# Patient Record
Sex: Male | Born: 1965 | Race: White | Hispanic: No | Marital: Married | Smoking: Never smoker
Health system: Southern US, Community
[De-identification: ages and names within clinical notes are randomized; demographics above are authoritative.]

## PROBLEM LIST (undated history)

## (undated) DIAGNOSIS — I10 Essential (primary) hypertension: Secondary | ICD-10-CM

## (undated) DIAGNOSIS — M1990 Unspecified osteoarthritis, unspecified site: Secondary | ICD-10-CM

## (undated) DIAGNOSIS — Z8719 Personal history of other diseases of the digestive system: Secondary | ICD-10-CM

## (undated) HISTORY — PX: NASAL SEPTUM SURGERY: SHX37

---

## 2011-08-16 HISTORY — PX: KNEE ARTHROSCOPY: SHX127

## 2016-04-05 ENCOUNTER — Other Ambulatory Visit: Payer: Self-pay | Admitting: Physician Assistant

## 2016-04-05 NOTE — H&P (Signed)
TOTAL KNEE ADMISSION H&P  Patient is being admitted for right total knee arthroplasty.  Subjective:  Chief Complaint:right knee pain.  HPI: Wayne ShutterDarren Allen, 50 y.o. male, has a history of pain and functional disability in the right knee due to arthritis and has failed non-surgical conservative treatments for greater than 12 weeks to includeNSAID's and/or analgesics, corticosteriod injections and activity modification.  Onset of symptoms was gradual, starting 10 years ago with rapidlly worsening course since that time. The patient noted prior procedures on the knee to include  arthroscopy and menisectomy on the right knee(s).  Patient currently rates pain in the right knee(s) at 3 out of 10 with activity. Patient has night pain, worsening of pain with activity and weight bearing, crepitus and joint swelling.  Patient has evidence of subchondral sclerosis and joint space narrowing by imaging studies. There is no active infection.  There are no active problems to display for this patient.  No past medical history on file.  No past surgical history on file.   (Not in a hospital admission) Allergies not on file  Social History  Substance Use Topics  . Smoking status: Not on file  . Smokeless tobacco: Not on file  . Alcohol use Not on file    No family history on file.   Review of Systems  Constitutional: Negative.   HENT: Negative.   Eyes: Negative.   Respiratory: Negative.   Cardiovascular: Negative.   Gastrointestinal: Negative.   Genitourinary: Negative.   Musculoskeletal: Positive for joint pain.  Skin: Negative.   Neurological: Negative.   Endo/Heme/Allergies: Negative.   Psychiatric/Behavioral: Negative.     Objective:  Physical Exam  Constitutional: He is oriented to person, place, and time. He appears well-developed and well-nourished.  HENT:  Head: Normocephalic and atraumatic.  Eyes: EOM are normal. Pupils are equal, round, and reactive to light.  Neck: Normal range  of motion. Neck supple.  Cardiovascular: Normal rate and regular rhythm.   Respiratory: Effort normal and breath sounds normal.  GI: Soft. Bowel sounds are normal.  Musculoskeletal:  Examination of his right knee reveals trace effusion, range of motion 0 to 125 degrees, no joint line tenderness, minimal patellofemoral crepitus, positive anterior Drawer.  He is neurovascularly intact distally.    Neurological: He is alert and oriented to person, place, and time.  Skin: Skin is warm and dry.  Psychiatric: He has a normal mood and affect. His behavior is normal. Judgment and thought content normal.    Vital signs in last 24 hours: @VSRANGES @  Labs:   There is no height or weight on file to calculate BMI.   Imaging Review Plain radiographs demonstrate severe degenerative joint disease of the right knee(s). The overall alignment ismild varus. The bone quality appears to be fair for age and reported activity level.  Assessment/Plan:  End stage arthritis, right knee   The patient history, physical examination, clinical judgment of the provider and imaging studies are consistent with end stage degenerative joint disease of the right knee(s) and total knee arthroplasty is deemed medically necessary. The treatment options including medical management, injection therapy arthroscopy and arthroplasty were discussed at length. The risks and benefits of total knee arthroplasty were presented and reviewed. The risks due to aseptic loosening, infection, stiffness, patella tracking problems, thromboembolic complications and other imponderables were discussed. The patient acknowledged the explanation, agreed to proceed with the plan and consent was signed. Patient is being admitted for inpatient treatment for surgery, pain control, PT, OT, prophylactic antibiotics, VTE  prophylaxis, progressive ambulation and ADL's and discharge planning. The patient is planning to be discharged home with home health  services

## 2016-04-05 NOTE — H&P (Signed)
TOTAL KNEE ADMISSION H&P  Patient is being admitted for right total knee arthroplasty.  Subjective:  Chief Complaint:right knee pain.  HPI: Wayne ShutterDarren Allen, 50 y.o. male, has a history of pain and functional disability in the right knee due to arthritis and has failed non-surgical conservative treatments for greater than 12 weeks to includeNSAID's and/or analgesics, corticosteriod injections and activity modification.  Onset of symptoms was gradual, starting 10 years ago with gradually worsening course since that time. The patient noted prior procedures on the knee to include  arthroscopy and menisectomy on the right knee(s).  Patient currently rates pain in the right knee(s) at 3 out of 10 with activity. Patient has night pain, worsening of pain with activity and weight bearing, pain that interferes with activities of daily living, crepitus and joint swelling.  Patient has evidence of subchondral sclerosis and joint space narrowing by imaging studies. There is no active infection.  There are no active problems to display for this patient.  No past medical history on file.  No past surgical history on file.  No prescriptions prior to admission.   Allergies not on file  Social History  Substance Use Topics  . Smoking status: Not on file  . Smokeless tobacco: Not on file  . Alcohol use Not on file    No family history on file.   Review of Systems  Constitutional: Negative.   HENT: Negative.   Eyes: Negative.   Respiratory: Negative.   Cardiovascular: Negative.   Gastrointestinal: Negative.   Genitourinary: Negative.   Musculoskeletal: Positive for joint pain.  Skin: Negative.   Neurological: Negative.   Endo/Heme/Allergies: Negative.   Psychiatric/Behavioral: Negative.     Objective:  Physical Exam  Constitutional: He is oriented to person, place, and time. He appears well-developed and well-nourished.  HENT:  Head: Normocephalic and atraumatic.  Eyes: EOM are normal. Pupils  are equal, round, and reactive to light.  Neck: Normal range of motion. Neck supple.  Cardiovascular: Normal rate and regular rhythm.   Respiratory: Effort normal and breath sounds normal.  GI: Soft. Bowel sounds are normal.  Musculoskeletal:  Examination of his right knee reveals trace effusion, range of motion 0 to 125 degrees, no joint line tenderness, minimal patellofemoral crepitus, positive anterior Drawer.  He is neurovascularly intact distally.      Neurological: He is alert and oriented to person, place, and time.  Skin: Skin is warm and dry.  Psychiatric: He has a normal mood and affect. His behavior is normal. Judgment and thought content normal.    Vital signs in last 24 hours:    Labs:   There is no height or weight on file to calculate BMI.   Imaging Review Plain radiographs demonstrate severe degenerative joint disease of the right knee(s). The overall alignment ismild varus. The bone quality appears to be fair for age and reported activity level.  Assessment/Plan:  End stage arthritis, right knee   The patient history, physical examination, clinical judgment of the provider and imaging studies are consistent with end stage degenerative joint disease of the right knee(s) and total knee arthroplasty is deemed medically necessary. The treatment options including medical management, injection therapy arthroscopy and arthroplasty were discussed at length. The risks and benefits of total knee arthroplasty were presented and reviewed. The risks due to aseptic loosening, infection, stiffness, patella tracking problems, thromboembolic complications and other imponderables were discussed. The patient acknowledged the explanation, agreed to proceed with the plan and consent was signed. Patient is being admitted  for inpatient treatment for surgery, pain control, PT, OT, prophylactic antibiotics, VTE prophylaxis, progressive ambulation and ADL's and discharge planning. The patient is  planning to be discharged home with home health services

## 2016-04-07 NOTE — Pre-Procedure Instructions (Addendum)
    Wayne Allen  04/07/2016      [facility] [address] - , [place] - [address] [name] [place] [address] Phone: [phone] Fax: [phone]    Your procedure is scheduled on Wednesday, August 6.  [facility] at 6:30 A.M.                Your surgery or procedure is scheduled for 8:30 AM              Call this number if you have problems the morning of [phone]               For any other questions, please call [phone], Monday - Friday 8 AM - 4 PM.   Remember:  Do not eat food or drink liquids after midnight. Tuesday, September 5.             Take these medicines the morning of surgery with A SIP OF WATER -   Do not wear jewelry, make-up or nail polish.  Do not wear lotions, powders, or perfumes, or deodorant.  Do not shave 48 hours prior to surgery.    Do not bring valuables to the hospital.  [facility] is not responsible for any belongings or valuables.  Contacts, dentures or bridgework may not be worn into surgery.  Leave your suitcase in the car.  After surgery it may be brought to your room.  For patients admitted to the hospital, discharge time will be determined by your treatment team.  Special instructions:  Review  [place] - Preparing For Surgery.  Please read over the following fact sheets that you were given: [facility]- Pre[facility] and Patient Instructions for Mupirocin Application, Incentive Spirometry

## 2016-04-08 ENCOUNTER — Encounter (HOSPITAL_COMMUNITY): Payer: Self-pay

## 2016-04-08 ENCOUNTER — Encounter (HOSPITAL_COMMUNITY)
Admission: RE | Admit: 2016-04-08 | Discharge: 2016-04-08 | Disposition: A | Discharge status: Home / Self Care | Payer: Worker's Compensation | Source: Ambulatory Visit | Attending: Orthopedic Surgery | Admitting: Orthopedic Surgery

## 2016-04-08 DIAGNOSIS — R001 Bradycardia, unspecified: Secondary | ICD-10-CM | POA: Diagnosis not present

## 2016-04-08 DIAGNOSIS — M1711 Unilateral primary osteoarthritis, right knee: Secondary | ICD-10-CM | POA: Diagnosis not present

## 2016-04-08 DIAGNOSIS — Z0183 Encounter for blood typing: Secondary | ICD-10-CM | POA: Insufficient documentation

## 2016-04-08 DIAGNOSIS — Z01812 Encounter for preprocedural laboratory examination: Secondary | ICD-10-CM | POA: Diagnosis not present

## 2016-04-08 DIAGNOSIS — Z01818 Encounter for other preprocedural examination: Secondary | ICD-10-CM | POA: Diagnosis not present

## 2016-04-08 HISTORY — DX: Personal history of other diseases of the digestive system: Z87.19

## 2016-04-08 HISTORY — DX: Unspecified osteoarthritis, unspecified site: M19.90

## 2016-04-08 LAB — CBC
HCT: 45.4 % (ref 39.0–52.0)
Hemoglobin: 14.8 g/dL (ref 13.0–17.0)
MCH: 30.3 pg (ref 26.0–34.0)
MCHC: 32.6 g/dL (ref 30.0–36.0)
MCV: 93 fL (ref 78.0–100.0)
PLATELETS: 262 10*3/uL (ref 150–400)
RBC: 4.88 MIL/uL (ref 4.22–5.81)
RDW: 13.4 % (ref 11.5–15.5)
WBC: 7.1 10*3/uL (ref 4.0–10.5)

## 2016-04-08 LAB — TYPE AND SCREEN
ABO/RH(D): A POS
ANTIBODY SCREEN: NEGATIVE

## 2016-04-08 LAB — SURGICAL PCR SCREEN
MRSA, PCR: NEGATIVE
Staphylococcus aureus: NEGATIVE

## 2016-04-08 LAB — ABO/RH: ABO/RH(D): A POS

## 2016-04-08 NOTE — Progress Notes (Signed)
Pt. Goes to [facility] off of [address] [place]. As needed. States he hasn't had a physical in 6 yrs.

## 2016-04-11 ENCOUNTER — Other Ambulatory Visit (HOSPITAL_COMMUNITY): Payer: Self-pay

## 2016-04-19 MED ORDER — LACTATED RINGERS IV SOLN
INTRAVENOUS | Status: DC
  Administered 2016-04-20 (×3): via INTRAVENOUS

## 2016-04-19 MED ORDER — TRANEXAMIC ACID 1000 MG/10ML IV SOLN
1000.0000 mg | INTRAVENOUS | Status: AC
  Administered 2016-04-20: 1000 mg via INTRAVENOUS
  Filled 2016-04-19: qty 10

## 2016-04-19 MED ORDER — CEFAZOLIN SODIUM-DEXTROSE 2-4 GM/100ML-% IV SOLN
2.0000 g | INTRAVENOUS | Status: AC
  Administered 2016-04-20: 2 g via INTRAVENOUS
  Filled 2016-04-19: qty 100

## 2016-04-20 ENCOUNTER — Encounter (HOSPITAL_COMMUNITY)
Admission: RE | Disposition: A | Discharge status: Home / Self Care | Payer: Self-pay | Source: Ambulatory Visit | Attending: Orthopedic Surgery

## 2016-04-20 ENCOUNTER — Inpatient Hospital Stay (HOSPITAL_COMMUNITY): Payer: Worker's Compensation | Admitting: Certified Registered"

## 2016-04-20 ENCOUNTER — Encounter (HOSPITAL_COMMUNITY): Payer: Self-pay | Admitting: *Deleted

## 2016-04-20 ENCOUNTER — Inpatient Hospital Stay (HOSPITAL_COMMUNITY)
Admission: RE | Admit: 2016-04-20 | Discharge: 2016-04-21 | DRG: 470 | Disposition: A | Discharge status: Home / Self Care | Payer: Worker's Compensation | Source: Ambulatory Visit | Attending: Orthopedic Surgery | Admitting: Orthopedic Surgery

## 2016-04-20 ENCOUNTER — Inpatient Hospital Stay (HOSPITAL_COMMUNITY): Payer: Worker's Compensation

## 2016-04-20 DIAGNOSIS — M25561 Pain in right knee: Secondary | ICD-10-CM | POA: Diagnosis present

## 2016-04-20 DIAGNOSIS — M1711 Unilateral primary osteoarthritis, right knee: Principal | ICD-10-CM

## 2016-04-20 HISTORY — PX: TOTAL KNEE ARTHROPLASTY: SHX125

## 2016-04-20 SURGERY — ARTHROPLASTY, KNEE, TOTAL
Anesthesia: Spinal | Site: Knee | Laterality: Right

## 2016-04-20 MED ORDER — BUPIVACAINE LIPOSOME 1.3 % IJ SUSP
20.0000 mL | INTRAMUSCULAR | Status: AC
  Administered 2016-04-20: 20 mL
  Filled 2016-04-20: qty 20

## 2016-04-20 MED ORDER — PROMETHAZINE HCL 25 MG/ML IJ SOLN: 6.2500 mg | INTRAMUSCULAR | Status: DC | PRN

## 2016-04-20 MED ORDER — PROPOFOL 10 MG/ML IV BOLUS
INTRAVENOUS | Status: AC
  Filled 2016-04-20: qty 20

## 2016-04-20 MED ORDER — MIDAZOLAM HCL 2 MG/2ML IJ SOLN
INTRAMUSCULAR | Status: AC
  Filled 2016-04-20: qty 2

## 2016-04-20 MED ORDER — PROPOFOL 500 MG/50ML IV EMUL
INTRAVENOUS | Status: DC | PRN
  Administered 2016-04-20: 100 ug/kg/min via INTRAVENOUS
  Administered 2016-04-20: 125 ug/kg/min via INTRAVENOUS

## 2016-04-20 MED ORDER — METOCLOPRAMIDE HCL 5 MG PO TABS: 5.0000 mg | ORAL_TABLET | Freq: Three times a day (TID) | ORAL | Status: DC | PRN

## 2016-04-20 MED ORDER — ONDANSETRON HCL 4 MG/2ML IJ SOLN
INTRAMUSCULAR | Status: AC
  Filled 2016-04-20: qty 2

## 2016-04-20 MED ORDER — METHOCARBAMOL 1000 MG/10ML IJ SOLN
500.0000 mg | Freq: Four times a day (QID) | INTRAVENOUS | Status: DC | PRN
  Filled 2016-04-20: qty 5

## 2016-04-20 MED ORDER — ACETAMINOPHEN 325 MG PO TABS: 650.0000 mg | ORAL_TABLET | Freq: Four times a day (QID) | ORAL | Status: DC | PRN

## 2016-04-20 MED ORDER — CEFAZOLIN SODIUM-DEXTROSE 2-4 GM/100ML-% IV SOLN
2.0000 g | Freq: Four times a day (QID) | INTRAVENOUS | Status: AC
  Administered 2016-04-20 (×2): 2 g via INTRAVENOUS
  Filled 2016-04-20 (×2): qty 100

## 2016-04-20 MED ORDER — ACETAMINOPHEN 650 MG RE SUPP: 650.0000 mg | Freq: Four times a day (QID) | RECTAL | Status: DC | PRN

## 2016-04-20 MED ORDER — OXYCODONE HCL 5 MG/5ML PO SOLN: 5.0000 mg | Freq: Once | ORAL | Status: DC | PRN

## 2016-04-20 MED ORDER — SENNA 8.6 MG PO TABS
1.0000 | ORAL_TABLET | Freq: Two times a day (BID) | ORAL | Status: DC
  Administered 2016-04-20 – 2016-04-21 (×2): 8.6 mg via ORAL
  Filled 2016-04-20 (×2): qty 1

## 2016-04-20 MED ORDER — BUPIVACAINE IN DEXTROSE 0.75-8.25 % IT SOLN
INTRATHECAL | Status: DC | PRN
  Administered 2016-04-20: 1.8 mL via INTRATHECAL

## 2016-04-20 MED ORDER — METHOCARBAMOL 500 MG PO TABS: 500.0000 mg | ORAL_TABLET | Freq: Four times a day (QID) | ORAL | 0 refills | Status: AC | PRN

## 2016-04-20 MED ORDER — OXYCODONE HCL 5 MG PO TABS
ORAL_TABLET | ORAL | Status: AC
  Filled 2016-04-20: qty 2

## 2016-04-20 MED ORDER — MORPHINE SULFATE (PF) 2 MG/ML IV SOLN: 2.0000 mg | INTRAVENOUS | Status: DC | PRN

## 2016-04-20 MED ORDER — APIXABAN 2.5 MG PO TABS
2.5000 mg | ORAL_TABLET | Freq: Two times a day (BID) | ORAL | Status: DC
  Administered 2016-04-21: 2.5 mg via ORAL
  Filled 2016-04-20: qty 1

## 2016-04-20 MED ORDER — ONDANSETRON HCL 4 MG PO TABS: 4.0000 mg | ORAL_TABLET | Freq: Three times a day (TID) | ORAL | 0 refills | Status: AC | PRN

## 2016-04-20 MED ORDER — LIDOCAINE HCL (CARDIAC) 20 MG/ML IV SOLN
INTRAVENOUS | Status: DC | PRN
  Administered 2016-04-20: 40 mg via INTRATRACHEAL

## 2016-04-20 MED ORDER — DOCUSATE SODIUM 100 MG PO CAPS: 100.0000 mg | ORAL_CAPSULE | Freq: Two times a day (BID) | ORAL | 0 refills | Status: AC

## 2016-04-20 MED ORDER — DIPHENHYDRAMINE HCL 12.5 MG/5ML PO ELIX: 12.5000 mg | ORAL_SOLUTION | ORAL | Status: DC | PRN

## 2016-04-20 MED ORDER — PHENOL 1.4 % MT LIQD
1.0000 | OROMUCOSAL | Status: DC | PRN
Start: 2016-04-20 — End: 2016-04-21

## 2016-04-20 MED ORDER — SORBITOL 70 % SOLN: 30.0000 mL | Freq: Every day | Status: DC | PRN

## 2016-04-20 MED ORDER — BUPIVACAINE HCL (PF) 0.5 % IJ SOLN
INTRAMUSCULAR | Status: AC
  Filled 2016-04-20: qty 10

## 2016-04-20 MED ORDER — PROPOFOL 10 MG/ML IV BOLUS
INTRAVENOUS | Status: DC | PRN
  Administered 2016-04-20 (×4): 50 mg via INTRAVENOUS

## 2016-04-20 MED ORDER — ONDANSETRON HCL 4 MG PO TABS: 4.0000 mg | ORAL_TABLET | Freq: Four times a day (QID) | ORAL | Status: DC | PRN

## 2016-04-20 MED ORDER — DEXAMETHASONE SODIUM PHOSPHATE 10 MG/ML IJ SOLN
INTRAMUSCULAR | Status: AC
  Filled 2016-04-20: qty 1

## 2016-04-20 MED ORDER — METHOCARBAMOL 500 MG PO TABS
500.0000 mg | ORAL_TABLET | Freq: Four times a day (QID) | ORAL | Status: DC | PRN
  Administered 2016-04-20 – 2016-04-21 (×3): 500 mg via ORAL
  Filled 2016-04-20 (×3): qty 1

## 2016-04-20 MED ORDER — MIDAZOLAM HCL 5 MG/5ML IJ SOLN
INTRAMUSCULAR | Status: DC | PRN
  Administered 2016-04-20: 2 mg via INTRAVENOUS

## 2016-04-20 MED ORDER — FENTANYL CITRATE (PF) 100 MCG/2ML IJ SOLN
INTRAMUSCULAR | Status: AC
  Filled 2016-04-20: qty 2

## 2016-04-20 MED ORDER — DEXAMETHASONE SODIUM PHOSPHATE 10 MG/ML IJ SOLN
10.0000 mg | Freq: Once | INTRAMUSCULAR | Status: AC
  Administered 2016-04-21: 10 mg via INTRAVENOUS
  Filled 2016-04-20: qty 1

## 2016-04-20 MED ORDER — MENTHOL 3 MG MT LOZG: 1.0000 | LOZENGE | OROMUCOSAL | Status: DC | PRN

## 2016-04-20 MED ORDER — DOCUSATE SODIUM 100 MG PO CAPS
100.0000 mg | ORAL_CAPSULE | Freq: Two times a day (BID) | ORAL | Status: DC
  Administered 2016-04-20 – 2016-04-21 (×2): 100 mg via ORAL
  Filled 2016-04-20 (×2): qty 1

## 2016-04-20 MED ORDER — SODIUM CHLORIDE 0.9 % IR SOLN
Status: DC | PRN
  Administered 2016-04-20 (×2): 1000 mL

## 2016-04-20 MED ORDER — METOCLOPRAMIDE HCL 5 MG/ML IJ SOLN: 5.0000 mg | Freq: Three times a day (TID) | INTRAMUSCULAR | Status: DC | PRN

## 2016-04-20 MED ORDER — HYDROMORPHONE HCL 1 MG/ML IJ SOLN: 0.2500 mg | INTRAMUSCULAR | Status: DC | PRN

## 2016-04-20 MED ORDER — PROPOFOL 1000 MG/100ML IV EMUL
INTRAVENOUS | Status: AC
  Filled 2016-04-20: qty 100

## 2016-04-20 MED ORDER — OXYCODONE HCL 5 MG PO TABS: 5.0000 mg | ORAL_TABLET | Freq: Once | ORAL | Status: DC | PRN

## 2016-04-20 MED ORDER — DEXTROSE-NACL 5-0.45 % IV SOLN
INTRAVENOUS | Status: AC
  Administered 2016-04-20: 18:00:00 via INTRAVENOUS

## 2016-04-20 MED ORDER — OXYCODONE-ACETAMINOPHEN 5-325 MG PO TABS: 1.0000 | ORAL_TABLET | ORAL | 0 refills | Status: AC | PRN

## 2016-04-20 MED ORDER — ONDANSETRON HCL 4 MG/2ML IJ SOLN
INTRAMUSCULAR | Status: DC | PRN
  Administered 2016-04-20: 4 mg via INTRAVENOUS

## 2016-04-20 MED ORDER — POLYETHYLENE GLYCOL 3350 17 G PO PACK: 17.0000 g | PACK | Freq: Every day | ORAL | Status: DC | PRN

## 2016-04-20 MED ORDER — OXYCODONE HCL 5 MG PO TABS
5.0000 mg | ORAL_TABLET | ORAL | Status: DC | PRN
  Administered 2016-04-20 – 2016-04-21 (×8): 10 mg via ORAL
  Filled 2016-04-20 (×7): qty 2

## 2016-04-20 MED ORDER — APIXABAN 2.5 MG PO TABS: 2.5000 mg | ORAL_TABLET | Freq: Two times a day (BID) | ORAL | 0 refills | Status: AC

## 2016-04-20 MED ORDER — ONDANSETRON HCL 4 MG/2ML IJ SOLN: 4.0000 mg | Freq: Four times a day (QID) | INTRAMUSCULAR | Status: DC | PRN

## 2016-04-20 MED ORDER — SODIUM CHLORIDE 0.9 % IJ SOLN
INTRAMUSCULAR | Status: DC | PRN
  Administered 2016-04-20: 40 mL via INTRAVENOUS

## 2016-04-20 MED ORDER — FENTANYL CITRATE (PF) 100 MCG/2ML IJ SOLN
INTRAMUSCULAR | Status: DC | PRN
  Administered 2016-04-20 (×2): 50 ug via INTRAVENOUS
  Administered 2016-04-20: 100 ug via INTRAVENOUS

## 2016-04-20 MED ORDER — DEXAMETHASONE SODIUM PHOSPHATE 10 MG/ML IJ SOLN
INTRAMUSCULAR | Status: DC | PRN
  Administered 2016-04-20: 10 mg via INTRAVENOUS

## 2016-04-20 MED ORDER — BUPIVACAINE HCL 0.5 % IJ SOLN
INTRAMUSCULAR | Status: DC | PRN
  Administered 2016-04-20: 10 mL

## 2016-04-20 MED ORDER — CELECOXIB 200 MG PO CAPS
200.0000 mg | ORAL_CAPSULE | Freq: Two times a day (BID) | ORAL | Status: DC
  Administered 2016-04-20 – 2016-04-21 (×2): 200 mg via ORAL
  Filled 2016-04-20 (×2): qty 1

## 2016-04-20 SURGICAL SUPPLY — 61 items
BANDAGE ACE 4X5 VEL STRL LF (GAUZE/BANDAGES/DRESSINGS) ×3 IMPLANT
BANDAGE ACE 6X5 VEL STRL LF (GAUZE/BANDAGES/DRESSINGS) ×3 IMPLANT
BANDAGE ESMARK 6X9 LF (GAUZE/BANDAGES/DRESSINGS) ×1 IMPLANT
BENZOIN TINCTURE PRP APPL 2/3 (GAUZE/BANDAGES/DRESSINGS) ×3 IMPLANT
BLADE SAG 18X100X1.27 (BLADE) ×6 IMPLANT
BNDG ESMARK 6X9 LF (GAUZE/BANDAGES/DRESSINGS) ×3
BOWL SMART MIX CTS (DISPOSABLE) ×3 IMPLANT
CAPT KNEE TOTAL 3 ×3 IMPLANT
CEMENT BONE SIMPLEX SPEEDSET (Cement) ×6 IMPLANT
CLOSURE WOUND 1/2 X4 (GAUZE/BANDAGES/DRESSINGS) ×2
COVER SURGICAL LIGHT HANDLE (MISCELLANEOUS) ×3 IMPLANT
CUFF TOURNIQUET SINGLE 34IN LL (TOURNIQUET CUFF) ×3 IMPLANT
DRAPE EXTREMITY T 121X128X90 (DRAPE) ×3 IMPLANT
DRAPE IMP U-DRAPE 54X76 (DRAPES) ×3 IMPLANT
DRAPE PROXIMA HALF (DRAPES) ×3 IMPLANT
DRAPE U-SHAPE 47X51 STRL (DRAPES) ×3 IMPLANT
DRSG AQUACEL AG ADV 3.5X10 (GAUZE/BANDAGES/DRESSINGS) ×3 IMPLANT
DURAPREP 26ML APPLICATOR (WOUND CARE) ×6 IMPLANT
ELECT CAUTERY BLADE 6.4 (BLADE) ×3 IMPLANT
ELECT REM PT RETURN 9FT ADLT (ELECTROSURGICAL) ×3
ELECTRODE REM PT RTRN 9FT ADLT (ELECTROSURGICAL) ×1 IMPLANT
EVACUATOR 1/8 PVC DRAIN (DRAIN) IMPLANT
FACESHIELD WRAPAROUND (MASK) ×6 IMPLANT
GLOVE BIOGEL PI IND STRL 7.0 (GLOVE) ×1 IMPLANT
GLOVE BIOGEL PI INDICATOR 7.0 (GLOVE) ×2
GLOVE ORTHO TXT STRL SZ7.5 (GLOVE) ×3 IMPLANT
GLOVE SURG ORTHO 7.0 STRL STRW (GLOVE) ×3 IMPLANT
GOWN STRL REUS W/ TWL LRG LVL3 (GOWN DISPOSABLE) ×2 IMPLANT
GOWN STRL REUS W/ TWL XL LVL3 (GOWN DISPOSABLE) ×1 IMPLANT
GOWN STRL REUS W/TWL LRG LVL3 (GOWN DISPOSABLE) ×4
GOWN STRL REUS W/TWL XL LVL3 (GOWN DISPOSABLE) ×2
HANDPIECE INTERPULSE COAX TIP (DISPOSABLE) ×2
IMMOBILIZER KNEE 22 UNIV (SOFTGOODS) ×3 IMPLANT
IMMOBILIZER KNEE 24 THIGH 36 (MISCELLANEOUS) IMPLANT
IMMOBILIZER KNEE 24 UNIV (MISCELLANEOUS)
KIT BASIN OR (CUSTOM PROCEDURE TRAY) ×3 IMPLANT
KIT ROOM TURNOVER OR (KITS) ×3 IMPLANT
MANIFOLD NEPTUNE II (INSTRUMENTS) ×3 IMPLANT
NEEDLE 18GX1X1/2 (RX/OR ONLY) (NEEDLE) ×3 IMPLANT
NEEDLE HYPO 25GX1X1/2 BEV (NEEDLE) ×3 IMPLANT
NS IRRIG 1000ML POUR BTL (IV SOLUTION) ×3 IMPLANT
PACK TOTAL JOINT (CUSTOM PROCEDURE TRAY) ×3 IMPLANT
PACK UNIVERSAL I (CUSTOM PROCEDURE TRAY) ×3 IMPLANT
PAD ARMBOARD 7.5X6 YLW CONV (MISCELLANEOUS) ×6 IMPLANT
SET HNDPC FAN SPRY TIP SCT (DISPOSABLE) ×1 IMPLANT
STRIP CLOSURE SKIN 1/2X4 (GAUZE/BANDAGES/DRESSINGS) ×4 IMPLANT
SUCTION FRAZIER HANDLE 10FR (MISCELLANEOUS) ×2
SUCTION TUBE FRAZIER 10FR DISP (MISCELLANEOUS) ×1 IMPLANT
SUT MNCRL AB 4-0 PS2 18 (SUTURE) ×3 IMPLANT
SUT VIC AB 0 CT1 27 (SUTURE) ×2
SUT VIC AB 0 CT1 27XBRD ANBCTR (SUTURE) ×1 IMPLANT
SUT VIC AB 1 CTX 36 (SUTURE) ×2
SUT VIC AB 1 CTX36XBRD ANBCTR (SUTURE) ×1 IMPLANT
SUT VIC AB 2-0 CT1 27 (SUTURE) ×4
SUT VIC AB 2-0 CT1 TAPERPNT 27 (SUTURE) ×2 IMPLANT
SYR 50ML LL SCALE MARK (SYRINGE) ×3 IMPLANT
SYR CONTROL 10ML LL (SYRINGE) ×3 IMPLANT
TAPE STRIPS DRAPE STRL (GAUZE/BANDAGES/DRESSINGS) ×3 IMPLANT
TOWEL OR 17X24 6PK STRL BLUE (TOWEL DISPOSABLE) ×3 IMPLANT
TOWEL OR 17X26 10 PK STRL BLUE (TOWEL DISPOSABLE) ×3 IMPLANT
WATER STERILE IRR 1000ML POUR (IV SOLUTION) ×3 IMPLANT

## 2016-04-20 NOTE — Progress Notes (Signed)
Orthopedic Tech Progress Note Patient Details:  Enriqueta ShutterDarren Mol 06/02/1966 [id number]  CPM Right Knee CPM Right Knee: On Right Knee Flexion (Degrees): 90 Right Knee Extension (Degrees): 0 Additional Comments: trapeze bar patient helper   Nikki DomCrawford, Musab Wingard 04/20/2016, 1:21 PM Viewed order from doctor's order list;trapeze bar patient helper will be provided when one becomes available

## 2016-04-20 NOTE — Anesthesia Preprocedure Evaluation (Signed)
Anesthesia Evaluation  Patient identified by MRN, date of birth, ID band Patient awake    Reviewed: Allergy & Precautions, NPO status , Patient's Chart, lab work & pertinent test results  Airway Mallampati: II  TM Distance: >3 FB Neck ROM: Full    Dental no notable dental hx.    Pulmonary neg pulmonary ROS,    Pulmonary exam normal        Cardiovascular negative cardio ROS Normal cardiovascular exam     Neuro/Psych negative neurological ROS     GI/Hepatic Neg liver ROS, hiatal hernia,   Endo/Other  negative endocrine ROS  Renal/GU negative Renal ROS     Musculoskeletal  (+) Arthritis ,   Abdominal   Peds  Hematology negative hematology ROS (+)   Anesthesia Other Findings Day of surgery medications reviewed with the patient.  Reproductive/Obstetrics                             Anesthesia Physical Anesthesia Plan  ASA: I  Anesthesia Plan: Spinal   Post-op Pain Management:    Induction:   Airway Management Planned:   Additional Equipment:   Intra-op Plan:   Post-operative Plan:   Informed Consent:   Plan Discussed with:   Anesthesia Plan Comments:         Anesthesia Quick Evaluation

## 2016-04-20 NOTE — Transfer of Care (Signed)
Immediate Anesthesia Transfer of Care Note  Patient: Wayne ShutterDarren Allen  Procedure(s) Performed: Procedure(s): RIGHT TOTAL KNEE ARTHROPLASTY (Right)  Patient Location: PACU  Anesthesia Type:Spinal  Level of Consciousness: awake, alert  and oriented  Airway & Oxygen Therapy: Patient Spontanous Breathing  Post-op Assessment: Report given to RN and Post -op Vital signs reviewed and stable  Post vital signs: Reviewed and stable  Last Vitals:  Vitals:   04/20/16 0827 04/20/16 0942  BP: 138/87 (!) 146/92  Pulse: 75 72  Resp: 20 18  Temp: 37 C 37.1 C    Last Pain:  Vitals:   04/20/16 0827  TempSrc: Oral         Complications: No apparent anesthesia complications

## 2016-04-20 NOTE — Interval H&P Note (Signed)
History and Physical Interval Note:  04/20/2016 8:29 AM  Wayne Allen  has presented today for surgery, with the diagnosis of djd right knee  The various methods of treatment have been discussed with the patient and family. After consideration of risks, benefits and other options for treatment, the patient has consented to  Procedure(s): RIGHT TOTAL KNEE ARTHROPLASTY (Right) as a surgical intervention .  The patient's history has been reviewed, patient examined, no change in status, stable for surgery.  I have reviewed the patient's chart and labs.  Questions were answered to the patient's satisfaction.     Loreta Aveaniel F Christinia Lambeth

## 2016-04-20 NOTE — Anesthesia Procedure Notes (Signed)
Procedure Name: MAC Date/Time: 04/20/2016 11:06 AM Performed by: Doyce LooseHOFFMAN, Noam Franzen ANN Pre-anesthesia Checklist: Patient identified, Emergency Drugs available, Suction available and Patient being monitored Patient Re-evaluated:Patient Re-evaluated prior to inductionOxygen Delivery Method: Simple face mask Intubation Type: IV induction

## 2016-04-20 NOTE — Anesthesia Procedure Notes (Signed)
Spinal  Patient location during procedure: OR Start time: 04/20/2016 11:01 AM End time: 04/20/2016 11:05 AM Staffing Anesthesiologist: Bonita QuinGUIDETTI, Jhoan Schmieder S Performed: anesthesiologist  Preanesthetic Checklist Completed: patient identified, site marked, surgical consent, pre-op evaluation, timeout performed, IV checked, risks and benefits discussed and monitors and equipment checked Spinal Block Patient position: sitting Prep: ChloraPrep Patient monitoring: heart rate, cardiac monitor, continuous pulse ox and blood pressure Approach: midline Location: L4-5 Injection technique: single-shot Needle Needle type: Pencil-Tip  Needle gauge: 25 G

## 2016-04-21 ENCOUNTER — Encounter (HOSPITAL_COMMUNITY): Payer: Self-pay | Admitting: Orthopedic Surgery

## 2016-04-21 NOTE — Anesthesia Postprocedure Evaluation (Signed)
Anesthesia Post Note  Patient: [name]  Procedure(s) Performed: Procedure(s) (LRB): RIGHT TOTAL KNEE ARTHROPLASTY (Right)  Patient location during evaluation: PACU Anesthesia Type: Spinal Level of consciousness: oriented and awake and alert Pain management: pain level controlled Vital Signs Assessment: post-procedure vital signs reviewed and stable Respiratory status: spontaneous breathing, respiratory function stable and patient connected to nasal cannula oxygen Cardiovascular status: blood pressure returned to baseline and stable Postop Assessment: no headache and no backache Anesthetic complications: no     Last Vitals:  Vitals:   04/21/16 0441 04/21/16 0553  BP: (!) 153/102 134/82  Pulse: 66   Resp: 16   Temp: 36.6 C     Last Pain:  Vitals:   04/21/16 0837  TempSrc:   PainSc: 5    Pain Goal: Patients Stated Pain Goal: 0 (04/21/16 0837)               Bonita Quinichard S Daja Shuping

## 2016-04-21 NOTE — Progress Notes (Signed)
PT Progress Note  Patient is making good progress with PT.  From a mobility standpoint anticipate patient will be ready for DC home with family support. Pt and spouse deny any questions or concerns.     04/21/16 1444  PT Visit Information  Last PT Received On 04/21/16  Assistance Needed +1  History of Present Illness 50 y.o. male now s/p modified minimally invasive total knee arthroplasty.  Subjective Data  Subjective be able to get home  Patient Stated Goal be able to dance again  Precautions  Precautions Knee;Fall  Restrictions  Weight Bearing Restrictions Yes  RLE Weight Bearing WBAT  Pain Assessment  Pain Assessment Faces  Faces Pain Scale 4  Pain Location Rt thigh  Pain Descriptors / Indicators Aching  Pain Intervention(s) Limited activity within patient's tolerance;Monitored during session  Cognition  Arousal/Alertness Awake/alert  Behavior During Therapy WFL for tasks assessed/performed  Overall Cognitive Status Within Functional Limits for tasks assessed  Bed Mobility  General bed mobility comments up in chair  Transfers  Overall transfer level Needs assistance  Equipment used Rolling walker (2 wheeled)  Transfers Sit to/from Stand  Sit to Stand Supervision  General transfer comment stable transfers performed  Ambulation/Gait  Ambulation/Gait assistance Supervision  Ambulation Distance (Feet) 110 Feet  Assistive device Rolling walker (2 wheeled)  Gait Pattern/deviations Step-through pattern  General Gait Details cues for even strides and knee flexion  Gait velocity decreased  Stairs Yes  Stairs assistance Min guard  Stair Management One rail Left;Step to pattern;Sideways  Number of Stairs 10  General stair comments pt and spouse report feeling confident with doing stairs. Also reviewed posterior approach with single step with backward approach.   Balance  Overall balance assessment Needs assistance  Sitting-balance support No upper extremity supported   Sitting balance-Leahy Scale Good  Standing balance support During functional activity  Standing balance-Leahy Scale Fair  Standing balance comment able to stand without support  Exercises  Exercises Total Joint  Total Joint Exercises  Ankle Circles/Pumps AROM;Both;10 reps  [employer]Quad Sets Strengthening;Right;10 reps  Short Arc Quad Strengthening;Right;10 reps  Heel Slides AAROM;Right;10 reps  Straight Leg Raises Strengthening;Right;5 reps  Long Arc Quad Strengthening;Right;5 reps  Knee Flexion AROM;Right;5 reps  Goniometric ROM knee flexion 70 degrees  PT - End of Session  Equipment Utilized During Treatment Gait belt  Activity Tolerance Patient tolerated treatment well  Patient left in chair;with call bell/phone within reach;with family/visitor present (in knee extension)  Nurse Communication Mobility status;Weight bearing status  PT - Assessment/Plan  PT Plan Current plan remains appropriate  PT Frequency (ACUTE ONLY) 7X/week  Follow Up Recommendations Home health PT;Supervision for mobility/OOB  PT equipment Rolling walker with 5" wheels  PT Goal Progression  Progress towards PT goals Progressing toward goals  Acute Rehab PT Goals  PT Goal Formulation With patient  Time For Goal Achievement 05/05/16  Potential to Achieve Goals Good  PT Time Calculation  PT Start Time (ACUTE ONLY) 1401  PT Stop Time (ACUTE ONLY) 1439  PT Time Calculation (min) (ACUTE ONLY) 38 min  PT General Charges  $$ ACUTE PT VISIT 1 Procedure  PT Treatments  $Gait Training 8-22 mins  $Therapeutic Exercise 23-37 mins  Christiane HaBenjamin J. Kaidon Kinker, PT, CSCS Pager [phone] Office [phone]

## 2016-04-21 NOTE — Discharge Instructions (Signed)
INSTRUCTIONS AFTER JOINT REPLACEMENT   o Remove items at home which could result in a fall. This includes throw rugs or furniture in walking pathways o ICE to the affected joint every three hours while awake for 30 minutes at a time, for at least the first 3-5 days, and then as needed for pain and swelling.  Continue to use ice for pain and swelling. You may notice swelling that will progress down to the foot and ankle.  This is normal after surgery.  Elevate your leg when you are not up walking on it.   o Continue to use the breathing machine you got in the hospital (incentive spirometer) which will help keep your temperature down.  It is common for your temperature to cycle up and down following surgery, especially at night when you are not up moving around and exerting yourself.  The breathing machine keeps your lungs expanded and your temperature down.  Take Eliquis as prescribed to help prevent blood clots.   DIET:  As you were doing prior to hospitalization, we recommend a well-balanced diet.  DRESSING / WOUND CARE / SHOWERING  Keep the surgical dressing until follow up.  IF THE DRESSING FALLS OFF or the wound gets wet inside, change the dressing with sterile gauze.  Please use good hand washing techniques before changing the dressing.  Do not use any lotions or creams on the incision until instructed by your surgeon.    ACTIVITY  o Increase activity slowly as tolerated, but follow the weight bearing instructions below.   o No driving for 6 weeks or until further direction given by your physician.  You cannot drive while taking narcotics.  o No lifting or carrying greater than 10 lbs. until further directed by your surgeon. o Avoid periods of inactivity such as sitting longer than an hour when not asleep. This helps prevent blood clots.  o You may return to work once you are authorized by your doctor.     WEIGHT BEARING   Weight bearing as tolerated with assist device (walker, cane,  etc) as directed, use it as long as suggested by your surgeon or therapist, typically at least 4-6 weeks.   EXERCISES  Results after joint replacement surgery are often greatly improved when you follow the exercise, range of motion and muscle strengthening exercises prescribed by your doctor. Safety measures are also important to protect the joint from further injury. Any time any of these exercises cause you to have increased pain or swelling, decrease what you are doing until you are comfortable again and then slowly increase them. If you have problems or questions, call your caregiver or physical therapist for advice.   Rehabilitation is important following a joint replacement. After just a few days of immobilization, the muscles of the leg can become weakened and shrink (atrophy).  These exercises are designed to build up the tone and strength of the thigh and leg muscles and to improve motion. Often times heat used for twenty to thirty minutes before working out will loosen up your tissues and help with improving the range of motion but do not use heat for the first two weeks following surgery (sometimes heat can increase post-operative swelling).   These exercises can be done on a training (exercise) mat, on the floor, on a table or on a bed. Use whatever works the best and is most comfortable for you.    Use music or television while you are exercising so that the exercises are a  pleasant break in your day. This will make your life better with the exercises acting as a break in your routine that you can look forward to.   Perform all exercises about fifteen times, three times per day or as directed.  You should exercise both the operative leg and the other leg as well.  Exercises include:    Quad Sets - Tighten up the muscle on the front of the thigh (Quad) and hold for 5-10 seconds.    Straight Leg Raises - With your knee straight (if you were given a brace, keep it on), lift the leg to 60  degrees, hold for 3 seconds, and slowly lower the leg.  Perform this exercise against resistance later as your leg gets stronger.   Leg Slides: Lying on your back, slowly slide your foot toward your buttocks, bending your knee up off the floor (only go as far as is comfortable). Then slowly slide your foot back down until your leg is flat on the floor again.   Angel Wings: Lying on your back spread your legs to the side as far apart as you can without causing discomfort.   Hamstring Strength:  Lying on your back, push your heel against the floor with your leg straight by tightening up the muscles of your buttocks.  Repeat, but this time bend your knee to a comfortable angle, and push your heel against the floor.  You may put a pillow under the heel to make it more comfortable if necessary.   A rehabilitation program following joint replacement surgery can speed recovery and prevent re-injury in the future due to weakened muscles. Contact your doctor or a physical therapist for more information on knee rehabilitation.    CONSTIPATION  Constipation is defined medically as fewer than three stools per week and severe constipation as less than one stool per week.  Even if you have a regular bowel pattern at home, your normal regimen is likely to be disrupted due to multiple reasons following surgery.  Combination of anesthesia, postoperative narcotics, change in appetite and fluid intake all can affect your bowels.   YOU MUST use at least one of the following options; they are listed in order of increasing strength to get the job done.  They are all available over the counter, and you may need to use some, POSSIBLY even all of these options:    Drink plenty of fluids (prune juice may be helpful) and high fiber foods Colace 100 mg by mouth twice a day  Senokot for constipation as directed and as needed Dulcolax (bisacodyl), take with full glass of water  Miralax (polyethylene glycol) once or twice a  day as needed.  If you have tried all these things and are unable to have a bowel movement in the first 3-4 days after surgery call either your surgeon or your primary doctor.    If you experience loose stools or diarrhea, hold the medications until you stool forms back up.  If your symptoms do not get better within 1 week or if they get worse, check with your doctor.  If you experience "the worst abdominal pain ever" or develop nausea or vomiting, please contact the office immediately for further recommendations for treatment.   ITCHING:  If you experience itching with your medications, try taking only a single pain pill, or even half a pain pill at a time.  You can also use Benadryl over the counter for itching or also to help with sleep.  TED HOSE STOCKINGS:  Use stockings on both legs until for at least 2 weeks or as directed by physician office. They may be removed at night for sleeping.  MEDICATIONS:  See your medication summary on the After Visit Summary that nursing will review with you.  You may have some home medications which will be placed on hold until you complete the course of blood thinner medication.  It is important for you to complete the blood thinner medication as prescribed.  PRECAUTIONS:  If you experience chest pain or shortness of breath - call 911 immediately for transfer to the hospital emergency department.   If you develop a fever greater that 101 F, purulent drainage from wound, increased redness or drainage from wound, foul odor from the wound/dressing, or calf pain - CONTACT YOUR SURGEON.                                                   FOLLOW-UP APPOINTMENTS:  If you do not already have a post-op appointment, please call the office for an appointment to be seen by your surgeon.  Guidelines for how soon to be seen are listed in your After Visit Summary, but are typically between 1-4 weeks after surgery.  OTHER INSTRUCTIONS:   Knee Replacement:  Do not place  pillow under knee, focus on keeping the knee straight while resting. CPM instructions: 0-90 degrees, 2 hours in the morning, 2 hours in the afternoon, and 2 hours in the evening. Place foam block, curve side up under heel at all times except when in CPM or when walking.  DO NOT modify, tear, cut, or change the foam block in any way.  MAKE SURE YOU:   Understand these instructions.   Get help right away if you are not doing well or get worse.    Thank you for letting us be a part of your medical care team.  It is a privilege we respect greatly.  We hope these instructions will help you stay on track for a fast and full recovery!   Information on my medicine - ELIQUIS (apixaban)  This medication education was reviewed with me or my healthcare representative as part of my discharge preparation.  The pharmacist that spoke with me during my hospital stay was:  Steffanie Rainwater, Student-PharmD  Why was Eliquis prescribed for you? Eliquis was prescribed for you to reduce the risk of blood clots forming after orthopedic surgery.    What do You need to know about Eliquis? Take your Eliquis TWICE DAILY - one tablet in the morning and one tablet in the evening with or without food.  It would be best to take the dose about the same time each day.  If you have difficulty swallowing the tablet whole please discuss with your pharmacist how to take the medication safely.  Take Eliquis exactly as prescribed by your doctor and DO NOT stop taking Eliquis without talking to the doctor who prescribed the medication.  Stopping without other medication to take the place of Eliquis may increase your risk of developing a clot.  After discharge, you should have regular check-up appointments with your healthcare provider that is prescribing your Eliquis.  What do you do if you miss a dose? If a dose of ELIQUIS is not taken at the scheduled time, take it as soon as possible on  the same day and twice-daily  administration should be resumed.  The dose should not be doubled to make up for a missed dose.  Do not take more than one tablet of ELIQUIS at the same time.  Important Safety Information A possible side effect of Eliquis is bleeding. You should call your healthcare provider right away if you experience any of the following: ? Bleeding from an injury or your nose that does not stop. ? Unusual colored urine (red or dark brown) or unusual colored stools (red or black). ? Unusual bruising for unknown reasons. ? A serious fall or if you hit your head (even if there is no bleeding).  Some medicines may interact with Eliquis and might increase your risk of bleeding or clotting while on Eliquis. To help avoid this, consult your healthcare provider or pharmacist prior to using any new prescription or non-prescription medications, including herbals, vitamins, non-steroidal anti-inflammatory drugs (NSAIDs) and supplements.  This website has more information on Eliquis (apixaban): http://www.eliquis.com/eliquis/home

## 2016-04-21 NOTE — Progress Notes (Signed)
Orthopedic Tech Progress Note Patient Details:  Enriqueta ShutterDarren Hinchey 04/19/1966 [id number]  Patient ID: Enriqueta Shutterarren Mealing, male   DOB: 04/24/1966, 50 y.o.   MRN: [id number] Applied cpm 0-60  Trinna PostMartinez, Linder Prajapati J 04/21/2016, 5:42 AM

## 2016-04-21 NOTE — Op Note (Signed)
[name]:  Wayne Allen, Wayne Allen               ACCOUNT NO.:  [id number]  MEDICAL RECORD NO.:  [id number]  LOCATION:  5N05C                        FACILITY:  [facility]  PHYSICIAN:  Loreta Aveaniel F. Murphy, M.D. DATE OF BIRTH:  1965/09/29  DATE OF PROCEDURE:  04/20/2016 DATE OF DISCHARGE:                              OPERATIVE REPORT   PREOPERATIVE DIAGNOSES: 1. Right knee end-stage arthritis, primary localized. 2. Chronic ACL deficiency. 3. Bone loss, lateral compartment, moderate.  POSTOPERATIVE DIAGNOSES: 1. Right knee end-stage arthritis, primary localized. 2. Chronic ACL deficiency. 3. Bone loss, lateral compartment, moderate.  PROCEDURE:  Right knee modified minimally invasive total knee replacement with Stryker Triathlon prosthesis.  A cemented pegged posterior stabilized #5 femoral component.  Cemented #6 tibial component, 9 mm PS insert.  Cemented resurfacing 38 mm patella component.  SURGEON:  Loreta Aveaniel F. Murphy, M.D.  ASSISTANT:  Ernest MallickHenry Mortenson, P.A., present throughout the entire case and necessary for timely completion of procedure.  ANESTHESIA:  Spinal.  BLOOD LOSS:  Minimal.  SPECIMENS:  None.  CULTURES:  None.  COMPLICATIONS:  None.  DRESSINGS:  Sterile compressive, knee immobilizer.  TOURNIQUET TIME:  1 hour.  PROCEDURE:  The patient was brought to operating room, and after adequate anesthesia had been obtained, tourniquet applied, prepped and draped in the usual sterile fashion.  Exsanguinated with elevation of Esmarch, tourniquet inflated to 350 mmHg.  Straight incision above the patella down to tibial tubercle.  Medial arthrotomy, vastus splitting, preserving quad tendon.  ACL deficiency, marked bony overgrowth and spurs throughout.  Intramedullary guide, distal femur.  8 mm resection, 5 degrees of valgus.  The femur was much wider, medial to lateral than AP.  Best fitting achieved with a #5 posterior stabilized pegged component.  This was placed after preparation of  the femur.  Proximal tibial resection with extramedullary guide.  A 3-degree posterior slope cut.  Sized to #6 component, rotation set with trials, and hand reamed. Patella exposed.  Posterior 10 mm removed.  Drilled, sized, and fitted for a 38 mm component.  At completion with a 9 mm PS insert, very pleased with balancing, full motion, stability, patellar tracking.  All trials removed.  Copious irrigation with pulse irrigating device. Cement prepared and placed on all components, firmly seated. Polyethylene attached to tibia, knee reduced.  Patella with a clamp. Once the cement hardened, the knee was irrigated again.  Soft tissues were injected with Exparel.  Arthrotomy closed with #1 Vicryl, the subcutaneous with subcuticular closure.  Margins were injected with Marcaine.  Sterile compressive dressing applied.  Tourniquet deflated, removed.  Knee immobilizer applied.  Anesthesia reversed.  Brought to the recovery room.  Tolerated the surgery well.  No complications.     Loreta Aveaniel F. Murphy, M.D.     DFM/MEDQ  D:  04/20/2016  T:  04/21/2016  Job:  [id number]

## 2016-04-21 NOTE — Discharge Summary (Signed)
Discharge Summary  Patient ID: Wayne Allen MRN: [id number] DOB/AGE: 1965-10-31 50 y.o.  Admit date: 04/20/2016 Discharge date: 04/21/2016  Admission Diagnoses:  Primary osteoarthritis of right knee  Discharge Diagnoses:  Principal Problem:   Primary osteoarthritis of right knee   Past Medical History:  Diagnosis Date  . Arthritis    knee- right  . History of hiatal hernia     Surgeries: Procedure(s): RIGHT TOTAL KNEE ARTHROPLASTY on 04/20/2016   Consultants (if any):   Discharged Condition: Improved  Subjective: Feeling good.  OOB.  Pain controlled with PO meds.  Tolerating diet.  Urinating.  No CP, SOB.  General: NAD.  Supine in bed.   Resp: No increased WOB. Cardio: regular rate and rhythm ABD soft Neurologically intact MSK Neurovascularly intact Sensation intact distally Dorsiflexion/Plantar flexion intact Incision: dressing C/D/I   Weight Bearing: Weight Bearing as Tolerated (WBAT) right leg Dressings: Aquacel.    Hospital Course: Wayne Allen is an 50 y.o. male who was admitted 04/20/2016 with a diagnosis of Primary osteoarthritis of right knee and went to the operating room on 04/20/2016 and underwent the above named procedures.    He was given perioperative antibiotics:  Anti-infectives    Start     Dose/Rate Route Frequency Ordered Stop   04/20/16 1800  ceFAZolin (ANCEF) IVPB 2g/100 mL premix     2 g 200 mL/hr over 30 Minutes Intravenous Every 6 hours 04/20/16 1720 04/21/16 0028   04/20/16 0800  ceFAZolin (ANCEF) IVPB 2g/100 mL premix     2 g 200 mL/hr over 30 Minutes Intravenous To ShortStay Surgical 04/19/16 1320 04/20/16 1119    .  He was given sequential compression devices, early ambulation, and Eliquis for DVT prophylaxis.  He benefited maximally from the hospital stay and there were no complications.    Recent vital signs:  Vitals:   04/21/16 0441 04/21/16 0553  BP: (!) 153/102 134/82  Pulse: 66   Resp: 16   Temp: 97.9 F (36.6 C)      Recent laboratory studies:  Lab Results  Component Value Date   HGB 14.8 04/08/2016   Lab Results  Component Value Date   WBC 7.1 04/08/2016   PLT 262 04/08/2016   No results found for: INR No results found for: NA, K, CL, CO2, BUN, CREATININE, GLUCOSE  Discharge Medications:     Medication List    TAKE these medications   apixaban 2.5 MG Tabs tablet Commonly known as:  ELIQUIS Take 1 tablet (2.5 mg total) by mouth 2 (two) times daily.   docusate sodium 100 MG capsule Commonly known as:  COLACE Take 1 capsule (100 mg total) by mouth 2 (two) times daily. To prevent constipation while taking pain medication.   methocarbamol 500 MG tablet Commonly known as:  ROBAXIN Take 1 tablet (500 mg total) by mouth every 6 (six) hours as needed for muscle spasms.   ondansetron 4 MG tablet Commonly known as:  ZOFRAN Take 1 tablet (4 mg total) by mouth every 8 (eight) hours as needed for nausea or vomiting.   oxyCODONE-acetaminophen 5-325 MG tablet Commonly known as:  ROXICET Take 1-2 tablets by mouth every 4 (four) hours as needed for severe pain.       Diagnostic Studies: Dg Knee 2 Views Right  Result Date: 04/20/2016 CLINICAL DATA:  Postop right knee total replacement EXAM: RIGHT KNEE - 3 VIEW COMPARISON:  None.  None FINDINGS: No evidence AP and portable cross-table lateral views of the right knee. Patient is status  post right knee replacement. There is no acute fracture or malalignment. There is gas in the soft tissues and the joint space, felt postoperative. Soft tissues are otherwise unremarkable. Of fracture, dislocation, or joint effusion. No evidence of arthropathy or other focal bone abnormality. Soft tissues are unremarkable. IMPRESSION: 1. Status post right knee replacement effusion 2. Gas in the soft tissues and joint space, felt postoperative Electronically Signed   By: Jasmine PangKim  Fujinaga M.D.   On: 04/20/2016 13:18    Disposition: Home with Home health PT   Follow-up  Information    Loreta Aveaniel F Murphy, MD. Schedule an appointment as soon as possible for a visit in 10 day(s).   Specialty:  Orthopedic Surgery Contact information: [address] [place] [address] [phone]            Signed: Albina BilletHenry Calvin Martensen III PA-C 04/21/2016, 6:33 AM

## 2016-04-21 NOTE — Evaluation (Signed)
Physical Therapy Evaluation Patient Details Name: Wayne ShutterDarren Allen MRN: [id number] DOB: 09/03/1965 Today's Date: 04/21/2016   History of Present Illness  50 y.o. male now s/p modified minimally invasive total knee arthroplasty.  Clinical Impression  Pt is s/p TKA resulting in the deficits listed below (see PT Problem List). Pt ambulating 110 ft with rw and supervision.  Pt will benefit from skilled PT to increase their independence and safety with mobility to allow discharge to home with family support. Will review HEP and perform stairs at next session.       Follow Up Recommendations Home health PT;Supervision for mobility/OOB    Equipment Recommendations  Rolling walker with 5" wheels    Recommendations for Other Services       Precautions / Restrictions Precautions Precautions: Knee;Fall Precaution Booklet Issued: Yes (comment) Precaution Comments: HEP provided, reviewed knee extension precautions Restrictions Weight Bearing Restrictions: Yes RLE Weight Bearing: Weight bearing as tolerated      Mobility  Bed Mobility Overal bed mobility: Needs Assistance Bed Mobility: Supine to Sit     Supine to sit: Min assist     General bed mobility comments: min assist provided with Rt LE  Transfers Overall transfer level: Needs assistance Equipment used: Rolling walker (2 wheeled) Transfers: Sit to/from Stand Sit to Stand: Min guard         General transfer comment: cues for hand placement  Ambulation/Gait Ambulation/Gait assistance: Supervision Ambulation Distance (Feet): 110 Feet Assistive device: Rolling walker (2 wheeled) Gait Pattern/deviations: Step-through pattern Gait velocity: decreased   General Gait Details: cues for even strides and weightbearing  Stairs            Wheelchair Mobility    Modified Rankin (Stroke Patients Only)       Balance Overall balance assessment: Needs assistance Sitting-balance support: No upper extremity  supported Sitting balance-Leahy Scale: Good     Standing balance support: During functional activity Standing balance-Leahy Scale: Fair Standing balance comment: able to stand static without UE support                             Pertinent Vitals/Pain Pain Assessment: Faces Faces Pain Scale: Hurts little more Pain Location: Rt thigh Pain Descriptors / Indicators: Aching Pain Intervention(s): Limited activity within patient's tolerance;Monitored during session;Ice applied;RN gave pain meds during session    Home Living Family/patient expects to be discharged to:: Private residence Living Arrangements: Spouse/significant other Available Help at Discharge: Family;Available 24 hours/day Type of Home: House Home Access: Stairs to enter   Entergy CorporationEntrance Stairs-Number of Steps: 1 Home Layout: Two level   Additional Comments: bathroom is on main level.     Prior Function Level of Independence: Independent               Hand Dominance        Extremity/Trunk Assessment   Upper Extremity Assessment: Overall WFL for tasks assessed           Lower Extremity Assessment: RLE deficits/detail RLE Deficits / Details: difficulty with SLR, needing assistance       Communication   Communication: No difficulties  Cognition Arousal/Alertness: Awake/alert Behavior During Therapy: WFL for tasks assessed/performed Overall Cognitive Status: Within Functional Limits for tasks assessed                      General Comments      Exercises        Assessment/Plan  PT Assessment Patient needs continued PT services  PT Diagnosis Difficulty walking   PT Problem List Decreased strength;Decreased range of motion;Decreased activity tolerance;Decreased balance;Decreased mobility  PT Treatment Interventions DME instruction;Gait training;Stair training;Functional mobility training;Therapeutic activities;Therapeutic exercise;Patient/family education   PT Goals  (Current goals can be found in the Care Plan section) Acute Rehab PT Goals Patient Stated Goal: be able to dance again PT Goal Formulation: With patient Time For Goal Achievement: 05/05/16 Potential to Achieve Goals: Good    Frequency 7X/week   Barriers to discharge        Co-evaluation               End of Session Equipment Utilized During Treatment: Gait belt Activity Tolerance: Patient tolerated treatment well Patient left: in bed;with call bell/phone within reach;with family/visitor present (in knee extension) Nurse Communication: Mobility status;Weight bearing status         Time: 9604-5409 PT Time Calculation (min) (ACUTE ONLY): 47 min   Charges:   PT Evaluation $PT Eval Moderate Complexity: 1 Procedure PT Treatments $Gait Training: 8-22 mins $Therapeutic Activity: 8-22 mins   PT G Codes:       Christiane Ha, PT, CSCS Pager [phone] Office [phone]  04/21/2016, 9:25 AM

## 2016-04-21 NOTE — Evaluation (Signed)
Occupational Therapy Evaluation Patient Details Name: Wayne ShutterDarren Allen MRN: [id number] DOB: 02/09/1966 Today's Date: 04/21/2016    History of Present Illness 50 y.o. male now s/p modified minimally invasive total knee arthroplasty.   Clinical Impression   Pt admitted with the above diagnoses and presents with below problem list. Pt will benefit from continued acute OT to address the below listed deficits and maximize independence with basic ADLs prior to d/c home. PTA pt was independent with ADLs. Pt is currently min guard for LB ADLs.       Follow Up Recommendations  Supervision - Intermittent;No OT follow up    Equipment Recommendations  3 in 1 bedside comode    Recommendations for Other Services       Precautions / Restrictions Precautions Precautions: Knee;Fall Precaution Booklet Issued: Yes (comment) Precaution Comments: reviewed precautions Restrictions Weight Bearing Restrictions: Yes RLE Weight Bearing: Weight bearing as tolerated      Mobility Bed Mobility Overal bed mobility: Needs Assistance Bed Mobility: Supine to Sit     Supine to sit: Min assist     General bed mobility comments: up in chair  Transfers Overall transfer level: Needs assistance Equipment used: Rolling walker (2 wheeled) Transfers: Sit to/from Stand Sit to Stand: Min guard         General transfer comment: from 3n1 and recliner    Balance Overall balance assessment: Needs assistance Sitting-balance support: No upper extremity supported Sitting balance-Leahy Scale: Good     Standing balance support: During functional activity;Bilateral upper extremity supported Standing balance-Leahy Scale: Fair Standing balance comment: able to stand static without UE support                            ADL Overall ADL's : Needs assistance/impaired Eating/Feeding: Set up;Sitting   Grooming: Min guard;Set up;Sitting;Standing   Upper Body Bathing: Set up;Sitting   Lower Body  Bathing: Min guard;Sit to/from stand   Upper Body Dressing : Set up;Sitting   Lower Body Dressing: Min guard;Sit to/from stand   Toilet Transfer: Min guard;RW   Toileting- ArchitectClothing Manipulation and Hygiene: Min guard;Sit to/from stand   Tub/ Shower Transfer: Minimal assistance;Ambulation;3 in 1;Rolling walker   Functional mobility during ADLs: Min guard;Rolling walker General ADL Comments: Pt completed in-room functional mobility at min guard level. Discussed ADL education. Handout given for tub transfer to 3n1.      Vision     Perception     Praxis      Pertinent Vitals/Pain Pain Assessment: 0-10 Pain Score: 2  Faces Pain Scale: Hurts little more Pain Location: Rt thigh Pain Descriptors / Indicators: Aching;Tightness Pain Intervention(s): Limited activity within patient's tolerance;Monitored during session;Repositioned     Hand Dominance     Extremity/Trunk Assessment Upper Extremity Assessment Upper Extremity Assessment: Overall WFL for tasks assessed   Lower Extremity Assessment Lower Extremity Assessment: Defer to PT evaluation RLE Deficits / Details: difficulty with SLR, needing assistance       Communication Communication Communication: No difficulties   Cognition Arousal/Alertness: Awake/alert Behavior During Therapy: WFL for tasks assessed/performed Overall Cognitive Status: Within Functional Limits for tasks assessed                     General Comments       Exercises       Shoulder Instructions      Home Living Family/patient expects to be discharged to:: Private residence Living Arrangements: Spouse/significant other Available Help at Discharge:  Family;Available 24 hours/day Type of Home: House Home Access: Stairs to enter [employer] of Steps: 1   Home Layout: Two level Alternate Level Stairs-Number of Steps: 18   Bathroom Shower/Tub: Chief Strategy Officer: Standard     Home Equipment: None    Additional Comments: bathroom is on main level.       Prior Functioning/Environment Level of Independence: Independent             OT Diagnosis: Acute pain   OT Problem List: Impaired balance (sitting and/or standing);Decreased knowledge of use of DME or AE;Decreased knowledge of precautions;Pain   OT Treatment/Interventions: Self-care/ADL training;DME and/or AE instruction;Therapeutic activities;Patient/family education;Balance training    OT Goals(Current goals can be found in the care plan section) Acute Rehab OT Goals Patient Stated Goal: be able to dance again OT Goal Formulation: With patient Time For Goal Achievement: 23-Apr-2016 Potential to Achieve Goals: Good ADL Goals Pt Will Perform Tub/Shower Transfer: with supervision;ambulating;3 in 1;rolling walker  OT Frequency: Min 1X/week   Barriers to D/C:            Co-evaluation              End of Session Equipment Utilized During Treatment: Gait belt;Rolling walker CPM Right Knee CPM Right Knee: Off Additional Comments: 0830  Activity Tolerance: Patient tolerated treatment well Patient left: in chair;with call bell/phone within reach   Time: 1002-1021 OT Time Calculation (min): 19 min Charges:  OT General Charges $OT Visit: 1 Procedure OT Evaluation $OT Eval Low Complexity: 1 Procedure G-Codes:    Pilar Grammes Apr 23, 2016, 11:53 AM

## 2016-04-23 ENCOUNTER — Encounter (HOSPITAL_COMMUNITY): Payer: Self-pay

## 2016-04-23 ENCOUNTER — Emergency Department (HOSPITAL_COMMUNITY): Payer: Worker's Compensation

## 2016-04-23 ENCOUNTER — Emergency Department (HOSPITAL_BASED_OUTPATIENT_CLINIC_OR_DEPARTMENT_OTHER): Payer: Worker's Compensation

## 2016-04-23 ENCOUNTER — Emergency Department (HOSPITAL_COMMUNITY)
Admission: EM | Admit: 2016-04-23 | Discharge: 2016-04-23 | Disposition: A | Discharge status: Home / Self Care | Payer: Worker's Compensation | Attending: Emergency Medicine | Admitting: Emergency Medicine

## 2016-04-23 DIAGNOSIS — I10 Essential (primary) hypertension: Secondary | ICD-10-CM | POA: Diagnosis not present

## 2016-04-23 DIAGNOSIS — Z7901 Long term (current) use of anticoagulants: Secondary | ICD-10-CM | POA: Insufficient documentation

## 2016-04-23 DIAGNOSIS — M25571 Pain in right ankle and joints of right foot: Secondary | ICD-10-CM

## 2016-04-23 DIAGNOSIS — R079 Chest pain, unspecified: Secondary | ICD-10-CM | POA: Insufficient documentation

## 2016-04-23 DIAGNOSIS — Z96651 Presence of right artificial knee joint: Secondary | ICD-10-CM | POA: Diagnosis not present

## 2016-04-23 DIAGNOSIS — M79609 Pain in unspecified limb: Secondary | ICD-10-CM

## 2016-04-23 DIAGNOSIS — M7989 Other specified soft tissue disorders: Secondary | ICD-10-CM

## 2016-04-23 HISTORY — DX: Essential (primary) hypertension: I10

## 2016-04-23 LAB — CBC WITH DIFFERENTIAL/PLATELET
BASOS PCT: 0 %
Basophils Absolute: 0 10*3/uL (ref 0.0–0.1)
Eosinophils Absolute: 0.1 10*3/uL (ref 0.0–0.7)
Eosinophils Relative: 1 %
HEMATOCRIT: 44 % (ref 39.0–52.0)
HEMOGLOBIN: 14.8 g/dL (ref 13.0–17.0)
LYMPHS ABS: 2.8 10*3/uL (ref 0.7–4.0)
Lymphocytes Relative: 24 %
MCH: 31.2 pg (ref 26.0–34.0)
MCHC: 33.6 g/dL (ref 30.0–36.0)
MCV: 92.6 fL (ref 78.0–100.0)
MONO ABS: 1.7 10*3/uL — AB (ref 0.1–1.0)
MONOS PCT: 14 %
NEUTROS ABS: 7.2 10*3/uL (ref 1.7–7.7)
Neutrophils Relative %: 61 %
Platelets: 279 10*3/uL (ref 150–400)
RBC: 4.75 MIL/uL (ref 4.22–5.81)
RDW: 13.4 % (ref 11.5–15.5)
WBC: 11.8 10*3/uL — ABNORMAL HIGH (ref 4.0–10.5)

## 2016-04-23 LAB — I-STAT CHEM 8, ED
BUN: 12 mg/dL (ref 6–20)
CALCIUM ION: 1.07 mmol/L — AB (ref 1.15–1.40)
CHLORIDE: 98 mmol/L — AB (ref 101–111)
CREATININE: 1.2 mg/dL (ref 0.61–1.24)
GLUCOSE: 114 mg/dL — AB (ref 65–99)
HCT: 45 % (ref 39.0–52.0)
Hemoglobin: 15.3 g/dL (ref 13.0–17.0)
Potassium: 3.7 mmol/L (ref 3.5–5.1)
SODIUM: 139 mmol/L (ref 135–145)
TCO2: 31 mmol/L (ref 0–100)

## 2016-04-23 LAB — TROPONIN I: Troponin I: <0.03 ng/mL (ref <0.03)

## 2016-04-23 MED ORDER — IOPAMIDOL (ISOVUE-370) INJECTION 76%
INTRAVENOUS | Status: AC
  Administered 2016-04-23: 90 mL
  Filled 2016-04-23: qty 100

## 2016-04-23 MED ORDER — HYDROMORPHONE HCL 1 MG/ML IJ SOLN
1.0000 mg | Freq: Once | INTRAMUSCULAR | Status: AC
  Administered 2016-04-23: 1 mg via INTRAVENOUS
  Filled 2016-04-23: qty 1

## 2016-04-23 MED ORDER — SODIUM CHLORIDE 0.9 % IV BOLUS (SEPSIS)
500.0000 mL | Freq: Once | INTRAVENOUS | Status: AC
  Administered 2016-04-23: 500 mL via INTRAVENOUS

## 2016-04-23 NOTE — ED Provider Notes (Signed)
[facility]-EMERGENCY DEPT Provider Note   CSN: [id number] Arrival date & time: 04/23/16  1303     History   Chief Complaint Chief Complaint  Patient presents with  . Chest Pain  . Shortness of Breath    HPI Rc Mcpeek is a 50 y.o. male.  The history is provided by the patient.  Chest Pain   This is a new problem. Associated symptoms include shortness of breath. Pertinent negatives include no abdominal pain, no back pain and no numbness.  Shortness of Breath  Associated symptoms include chest pain. Pertinent negatives include no abdominal pain.  Patient presents with shortness of breath and right-sided chest pain. Began last night. Recent knee replacement within the last week by Dr. Eulah Pont. Also has more pain in his right leg and increased pain in his right ankle. No fevers or chills. He is on anticoagulation. No fall. He does not smoke. He's also had more pain on the medial aspect of his right knee. Had had some more bruising in his right foot also. No hemoptysis. No fevers.  Past Medical History:  Diagnosis Date  . Arthritis    knee- right  . History of hiatal hernia   . Hypertension     Patient Active Problem List   Diagnosis Date Noted  . Primary osteoarthritis of right knee 04/20/2016    Past Surgical History:  Procedure Laterality Date  . KNEE ARTHROSCOPY Right 2013  . NASAL SEPTUM SURGERY    . TOTAL KNEE ARTHROPLASTY Right 04/20/2016   Procedure: RIGHT TOTAL KNEE ARTHROPLASTY;  Surgeon: Loreta Ave, MD;  Location: [facility] OR;  Service: Orthopedics;  Laterality: Right;       Home Medications    Prior to Admission medications   Medication Sig Start Date End Date Taking? Authorizing Provider  apixaban (ELIQUIS) 2.5 MG TABS tablet Take 1 tablet (2.5 mg total) by mouth 2 (two) times daily. 04/20/16  Yes Loreta Ave, MD  docusate sodium (COLACE) 100 MG capsule Take 1 capsule (100 mg total) by mouth 2 (two) times daily. To prevent constipation while taking pain  medication. 04/20/16  Yes Loreta Ave, MD  methocarbamol (ROBAXIN) 500 MG tablet Take 1 tablet (500 mg total) by mouth every 6 (six) hours as needed for muscle spasms. 04/20/16  Yes Loreta Ave, MD  oxyCODONE-acetaminophen (ROXICET) 5-325 MG tablet Take 1-2 tablets by mouth every 4 (four) hours as needed for severe pain. 04/20/16  Yes Loreta Ave, MD  ondansetron (ZOFRAN) 4 MG tablet Take 1 tablet (4 mg total) by mouth every 8 (eight) hours as needed for nausea or vomiting. Patient not taking: Reported on 04/23/2016 04/20/16   Loreta Ave, MD    Family History No family history on file.  Social History Social History  Substance Use Topics  . Smoking status: Never Smoker  . Smokeless tobacco: Never Used  . Alcohol use 0.6 oz/week    1 Cans of beer per week     Allergies   Review of patient's allergies indicates no known allergies.   Review of Systems Review of Systems  Constitutional: Negative for appetite change.  Respiratory: Positive for shortness of breath.   Cardiovascular: Positive for chest pain.  Gastrointestinal: Negative for abdominal pain.  Genitourinary: Negative for flank pain.  Musculoskeletal: Negative for back pain.       Right ankle pain. States with swelling of right lower leg and knee area.  Skin: Positive for wound.  Neurological: Negative for numbness.  Hematological: Bruises/bleeds  easily.     Physical Exam Updated Vital Signs BP 142/89   Pulse 83   Temp 99.7 F (37.6 C) (Oral)   Resp 16   Ht 6' (1.829 m)   Wt 235 lb (106.6 kg)   SpO2 98%   BMI 31.87 kg/m   Physical Exam  Constitutional: He appears well-developed.  Patient is anxious.  HENT:  Head: Atraumatic.  Eyes: EOM are normal.  Cardiovascular: Normal rate.   Pulmonary/Chest: Effort normal.  Abdominal: Soft. There is no tenderness.  Musculoskeletal: He exhibits edema.  Ecchymosis and swelling of right lower leg. Dressing from right knee replacement intact. Tenderness over  right lateral malleolus posteriorly. Pulse intact in right foot.  Skin: Skin is warm. Capillary refill takes less than 2 seconds.     ED Treatments / Results  Labs (all labs ordered are listed, but only abnormal results are displayed) Labs Reviewed  CBC WITH DIFFERENTIAL/PLATELET - Abnormal; Notable for the following:       Result Value   WBC 11.8 (*)    Monocytes Absolute 1.7 (*)    All other components within normal limits  I-STAT CHEM 8, ED - Abnormal; Notable for the following:    Chloride 98 (*)    Glucose, Bld 114 (*)    Calcium, Ion 1.07 (*)    All other components within normal limits  TROPONIN I    EKG  EKG Interpretation  Date/Time:  Saturday April 23 2016 13:10:20 EDT Ventricular Rate:  93 PR Interval:    QRS Duration: 93 QT Interval:  337 QTC Calculation: 420 R Axis:   60 Text Interpretation:  Sinus rhythm Probable left atrial enlargement Borderline ST depression, diffuse leads Confirmed by Rubin Payor  MD, Harrold Donath [phone]) on 04/23/2016 1:17:48 PM       Radiology Dg Ankle Complete Right  Result Date: 04/23/2016 CLINICAL DATA:  Right ankle pain after physical therapy yesterday without known injury. EXAM: RIGHT ANKLE - COMPLETE 3+ VIEW COMPARISON:  None. FINDINGS: There is no evidence of fracture, dislocation, or joint effusion. There is no evidence of arthropathy or other focal bone abnormality. Soft tissues are unremarkable. IMPRESSION: Normal right ankle. Electronically Signed   By: Lupita Raider, M.D.   On: 04/23/2016 15:03   Ct Angio Chest Pe W And/or Wo Contrast  Result Date: 04/23/2016 CLINICAL DATA:  Shortness of breath and chest pain EXAM: CT ANGIOGRAPHY CHEST WITH CONTRAST TECHNIQUE: Multidetector CT imaging of the chest was performed using the standard protocol during bolus administration of intravenous contrast. Multiplanar CT image reconstructions and MIPs were obtained to evaluate the vascular anatomy. CONTRAST:  90 cc of Isovue 370 COMPARISON:   None. FINDINGS: Mediastinum/Lymph Nodes: Moderate cardiac enlargement. No pericardial effusion identified. The main pulmonary artery is patent. No saddle embolus. No lobar or segmental pulmonary artery filling defect suggestive of a clinically significant pulmonary embolus. The trachea appears patent and is midline. Normal appearance of the esophagus. Multiple prominent mediastinal nodes are identified. Index right paratracheal lymph node measures 1 cm, image 47 of series 6. Lungs/Pleura: No pleural effusion. No airspace consolidation or atelectasis. Perifissural nodule in the right middle lobe measures 5 mm, image 47 of series 7. Subpleural nodule in the left lower lobe measures 4 mm. Upper abdomen: The adrenal glands are normal. No suspicious liver abnormality identified. The spleen is unremarkable. Normal appearance of the adrenal glands and pancreas. Musculoskeletal: Spondylosis within the thoracic spine. No aggressive lytic or sclerotic bone lesion. Review of the MIP images confirms the  above findings. IMPRESSION: 1. No acute findings.  No evidence for acute pulmonary embolus. 2. Prominent mediastinal lymph nodes are noted measuring up to 1 cm. Nonspecific. 3. Small nodules are identified measuring up to 5 mm. No follow-up needed if patient is low-risk. Non-contrast chest CT can be considered in 12 months if patient is high-risk. This recommendation follows the consensus statement: Guidelines for Management of Incidental Pulmonary Nodules Detected on CT Images:From the Fleischner Society 2017; published online before print (10.1148/[id number]). Electronically Signed   By: Signa Kellaylor  Stroud M.D.   On: 04/23/2016 14:56    Procedures Procedures (including critical care time)  Medications Ordered in ED Medications  sodium chloride 0.9 % bolus 500 mL (0 mLs Intravenous Stopped 04/23/16 1449)  HYDROmorphone (DILAUDID) injection 1 mg (1 mg Intravenous Given 04/23/16 1354)  iopamidol (ISOVUE-370) 76 %  injection (90 mLs  Contrast Given 04/23/16 1417)     Initial Impression / Assessment and Plan / ED Course  I have reviewed the triage vital signs and the nursing notes.  Pertinent labs & imaging results that were available during my care of the patient were reviewed by me and considered in my medical decision making (see chart for details).  Clinical Course    Patient with shortness of breath and chest pain. Began last night. Recent knee surgery. CT angiography done reassuring. X-ray reassuring. Low risk for lung nodules. Venous Doppler to be done to evaluate for DVT. Likely discharge home.  Final Clinical Impressions(s) / ED Diagnoses   Final diagnoses:  Chest pain, unspecified chest pain type  Right ankle pain    New Prescriptions New Prescriptions   No medications on file     Benjiman CoreNathan Katasha Riga, MD 04/23/16 1615

## 2016-04-23 NOTE — ED Triage Notes (Signed)
Pt arrives POV with c/o worsening CP and SOB that started this morning around midnight. He reports total right knee replacement on Wednesday. RR 25, + right pedal pulse.

## 2016-04-23 NOTE — ED Notes (Signed)
Pt noted to be saturated in sweat to head. MD notified. MD came in to re-evaluate patient.

## 2016-04-23 NOTE — ED Notes (Signed)
Pt returned from CT and went to xray per transport.

## 2016-04-23 NOTE — Progress Notes (Signed)
VASCULAR LAB PRELIMINARY  PRELIMINARY  PRELIMINARY  PRELIMINARY  Right lower extremity venous duplex completed.    Preliminary report:  There is no DVT or SVT noted in the right lower extremity.  Enlarged inguinal lymph nodes noted.   Gregery Walberg, RVT 04/23/2016, 4:16 PM

## 2016-04-23 NOTE — ED Notes (Signed)
Dr. Rubin PayorPickering to bedside upon pts arrival to room 2.

## 2018-01-30 IMAGING — CT CT ANGIO CHEST
2 of 9 series · 18 of 46 positions shown · IV contrast (OMNI)
Comparison: None.

CLINICAL DATA: Shortness of breath and chest pain

EXAM:
CT ANGIOGRAPHY CHEST WITH CONTRAST
TECHNIQUE: Multidetector CT imaging of the chest was performed using the
standard protocol during bolus administration of intravenous
contrast. Multiplanar CT image reconstructions and MIPs were
obtained to evaluate the vascular anatomy.
CONTRAST:  90 cc of Isovue 370

[Series 6: thins · axial · 0.67mm/px · z∈[-352,-71]mm · 15 of 311 slices shown]
[im 15/311  lung]
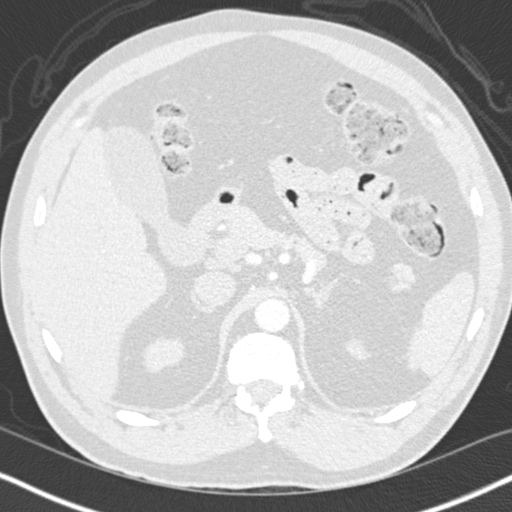
[im 43/311  soft-tissue]
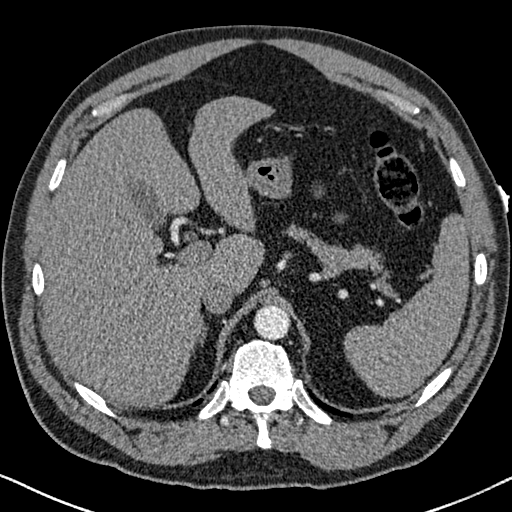
[im 57/311  lung]
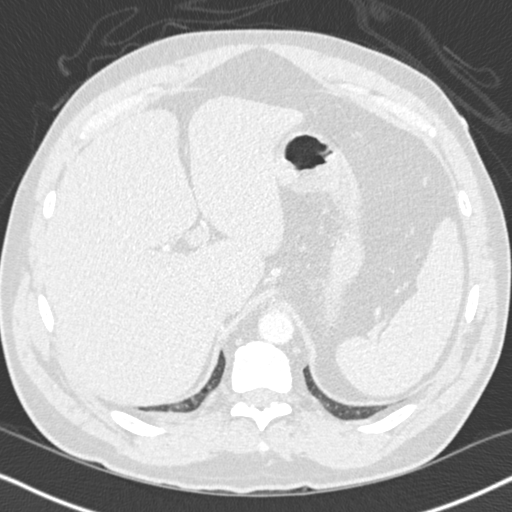
[im 71/311  soft-tissue]
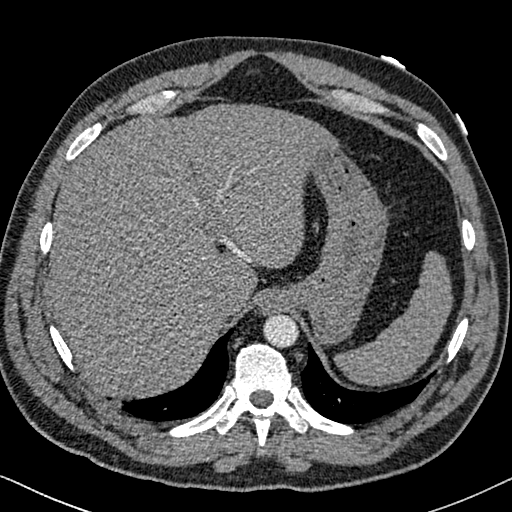
[im 99/311  lung]
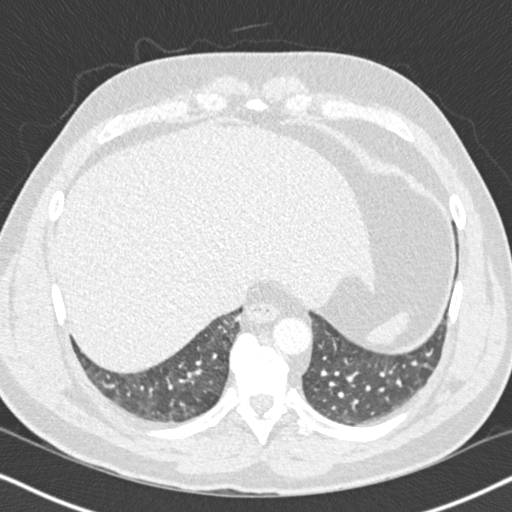
[im 113/311  soft-tissue]
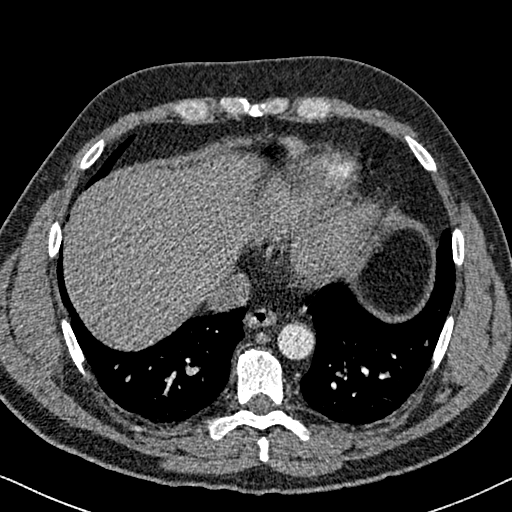
[im 141/311  lung]
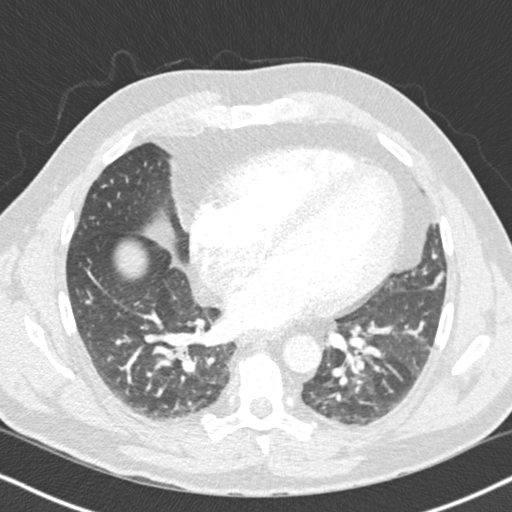
[im 156/311  soft-tissue]
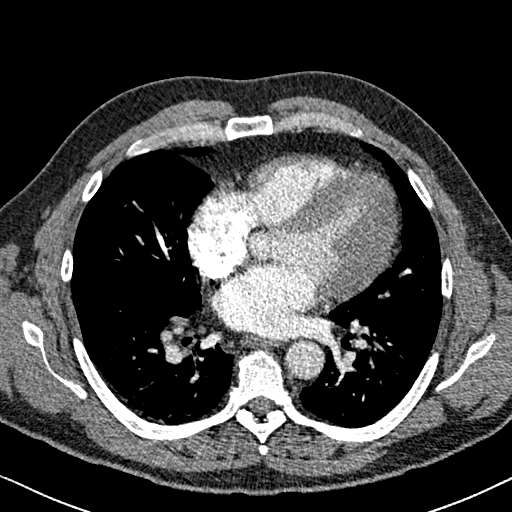
[im 170/311  lung]
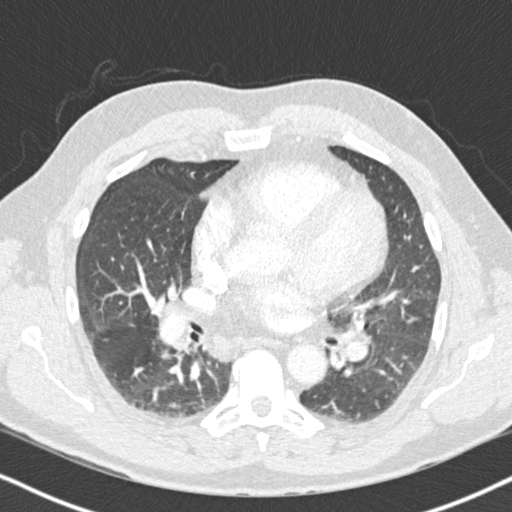
[im 198/311  soft-tissue]
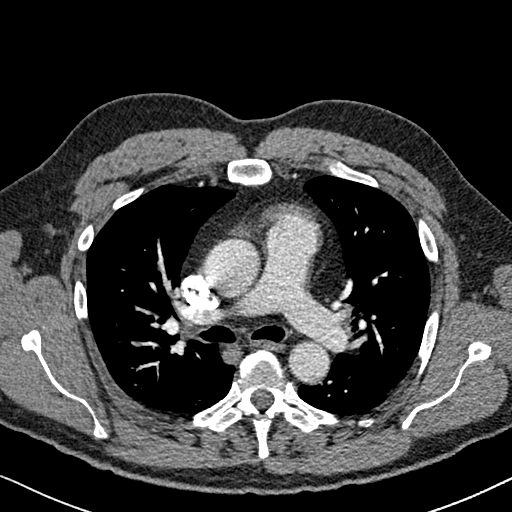
[im 212/311  lung]
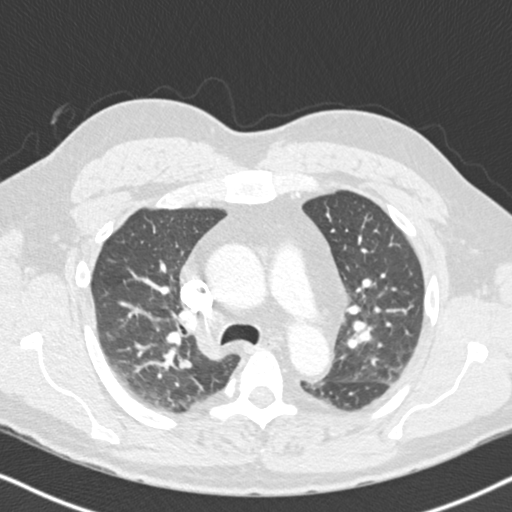
[im 240/311  soft-tissue]
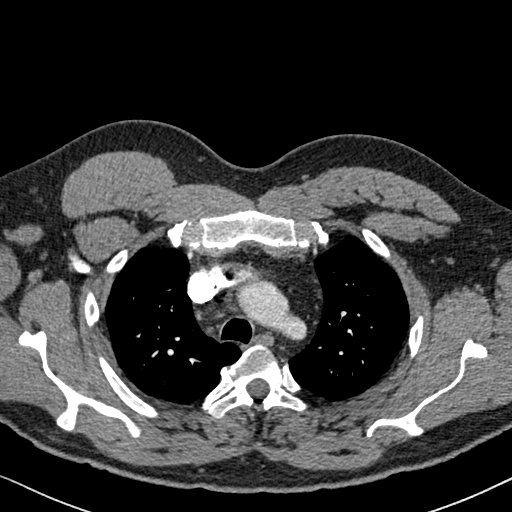
[im 254/311  lung]
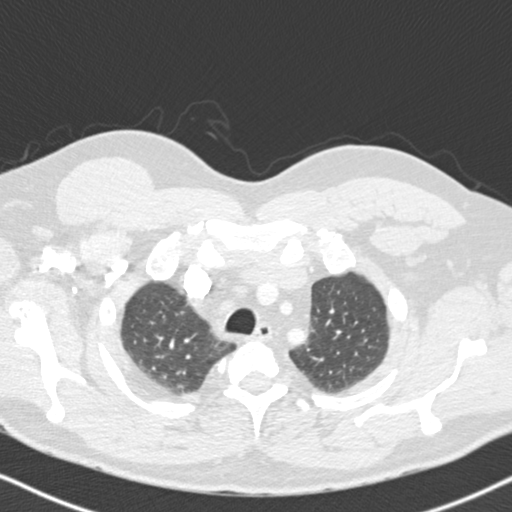
[im 268/311  soft-tissue]
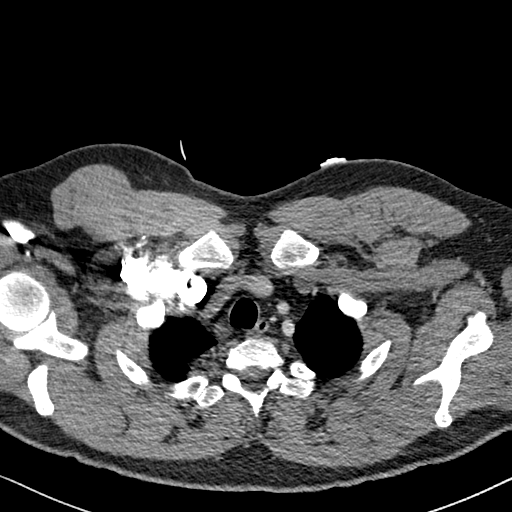
[im 296/311  lung]
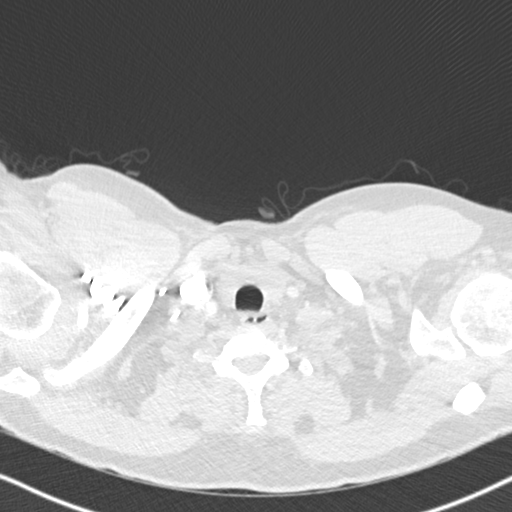

[Series 8: coronal mpr · coronal · 0.61mm/px · 3 of 134 slices shown]
[im 34/134  soft-tissue]
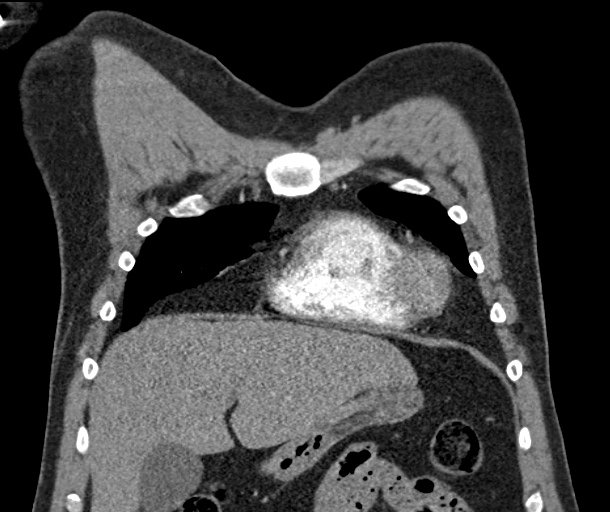
[im 67/134  soft-tissue]
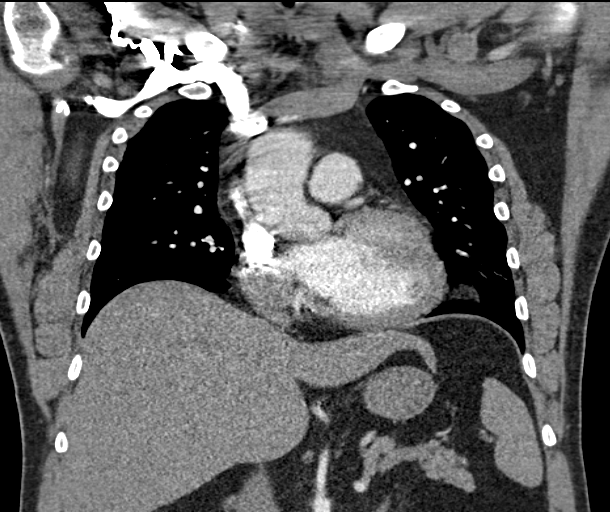
[im 100/134  soft-tissue]
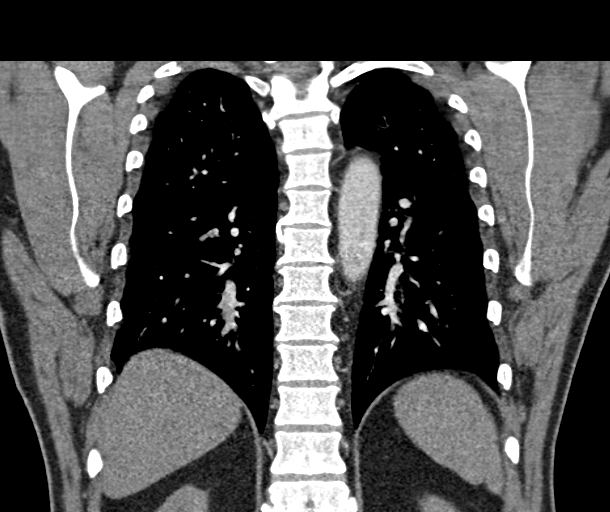

[18 of 46 positions shown; findings below may reference images not displayed]

FINDINGS: Mediastinum/Lymph Nodes: Moderate cardiac enlargement. No
pericardial effusion identified. The main pulmonary artery is
patent. No saddle embolus. No lobar or segmental pulmonary artery
filling defect suggestive of a clinically significant pulmonary
embolus.

The trachea appears patent and is midline. Normal appearance of the
esophagus. Multiple prominent mediastinal nodes are identified.
Index right paratracheal lymph node measures 1 cm, image 47 of
series 6.

Lungs/Pleura: No pleural effusion. No airspace consolidation or
atelectasis. Perifissural nodule in the right middle lobe measures 5
mm, image 47 of series 7. Subpleural nodule in the left lower lobe
measures 4 mm.

Upper abdomen: The adrenal glands are normal. No suspicious liver
abnormality identified. The spleen is unremarkable. Normal
appearance of the adrenal glands and pancreas.

Musculoskeletal: Spondylosis within the thoracic spine. No
aggressive lytic or sclerotic bone lesion.

Review of the MIP images confirms the above findings.
IMPRESSION: 1. No acute findings.  No evidence for acute pulmonary embolus.
2. Prominent mediastinal lymph nodes are noted measuring up to 1 cm.
Nonspecific.
3. Small nodules are identified measuring up to 5 mm. No follow-up
needed if patient is low-risk. Non-contrast chest CT can be
considered in 12 months if patient is high-risk. This recommendation
follows the consensus statement: Guidelines for Management of
Incidental Pulmonary Nodules Detected on CT Images:From the
[HOSPITAL] 4856; published online before print
(10.1148/radiol.[id number]).
